# Patient Record
Sex: Male | Born: 1985 | Race: White | Hispanic: No | Marital: Single | Smoking: Never smoker
Health system: Southern US, Community
[De-identification: ages and names within clinical notes are randomized; demographics above are authoritative.]

## PROBLEM LIST (undated history)

## (undated) DIAGNOSIS — G43909 Migraine, unspecified, not intractable, without status migrainosus: Secondary | ICD-10-CM

## (undated) DIAGNOSIS — K589 Irritable bowel syndrome without diarrhea: Secondary | ICD-10-CM

## (undated) HISTORY — DX: Irritable bowel syndrome without diarrhea: K58.9

## (undated) HISTORY — DX: Migraine, unspecified, not intractable, without status migrainosus: G43.909

## (undated) HISTORY — PX: OTHER SURGICAL HISTORY: SHX169

---
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
## 2014-04-13 ENCOUNTER — Emergency Department (HOSPITAL_COMMUNITY): Payer: BC Managed Care – PPO

## 2014-04-13 ENCOUNTER — Encounter (HOSPITAL_COMMUNITY): Payer: Self-pay | Admitting: Emergency Medicine

## 2014-04-13 ENCOUNTER — Emergency Department (HOSPITAL_COMMUNITY)
Admission: EM | Admit: 2014-04-13 | Discharge: 2014-04-13 | Disposition: A | Discharge status: Home / Self Care | Payer: BC Managed Care – PPO | Attending: Emergency Medicine | Admitting: Emergency Medicine

## 2014-04-13 DIAGNOSIS — G43901 Migraine, unspecified, not intractable, with status migrainosus: Secondary | ICD-10-CM | POA: Insufficient documentation

## 2014-04-13 DIAGNOSIS — Z791 Long term (current) use of non-steroidal anti-inflammatories (NSAID): Secondary | ICD-10-CM | POA: Diagnosis not present

## 2014-04-13 DIAGNOSIS — R51 Headache: Secondary | ICD-10-CM | POA: Diagnosis present

## 2014-04-13 MED ORDER — METOCLOPRAMIDE HCL 5 MG/ML IJ SOLN
10.0000 mg | Freq: Once | INTRAMUSCULAR | Status: AC
  Administered 2014-04-13: 10 mg via INTRAVENOUS
  Filled 2014-04-13: qty 2

## 2014-04-13 MED ORDER — DIPHENHYDRAMINE HCL 50 MG/ML IJ SOLN
25.0000 mg | Freq: Once | INTRAMUSCULAR | Status: AC
  Administered 2014-04-13: 25 mg via INTRAVENOUS
  Filled 2014-04-13: qty 1

## 2014-04-13 MED ORDER — DEXAMETHASONE SODIUM PHOSPHATE 10 MG/ML IJ SOLN
10.0000 mg | Freq: Once | INTRAMUSCULAR | Status: AC
  Administered 2014-04-13: 10 mg via INTRAVENOUS
  Filled 2014-04-13: qty 1

## 2014-04-13 MED ORDER — KETOROLAC TROMETHAMINE 30 MG/ML IJ SOLN
30.0000 mg | Freq: Once | INTRAMUSCULAR | Status: AC
  Administered 2014-04-13: 30 mg via INTRAVENOUS
  Filled 2014-04-13: qty 1

## 2014-04-13 NOTE — ED Provider Notes (Signed)
Medical screening examination/treatment/procedure(s) were performed by non-physician practitioner and as supervising physician I was immediately available for consultation/collaboration.    Vida Roller, MD 04/13/14 [phone]

## 2014-04-13 NOTE — ED Notes (Addendum)
Patient c/o headache x40 days. Patient states it feels like he has been hit in the back of the head with a hammer, with a warming sensation that radiates into his neck. Patient reports lightheadedness and seeing black spots. Patient states he went to UC on Wednesday was treated for migraine, states medication slightly dulled the headache but the headache still is present. Patient states he has been taking vicodin, imitrex, voltaran, and reglan without relief. Patient c/o constant nausea, with vomiting yesterday. Denies any head trauma.

## 2014-04-13 NOTE — ED Provider Notes (Signed)
CSN: [id number]     Arrival date & time 04/13/14  0259 History   First MD Initiated Contact with Patient 04/13/14 [phone]     Chief Complaint  Patient presents with  . Migraine   HPI  History provided by the patient. Patient is a 17 male with no significant PMH presenting with complaints of persistent unimproved headache. Patient complains of a diffuse frontal and side headache that has been persistent without change for the last 40 days. He was evaluated recently at PCP office and given prescriptions for Imitrex, Reglan, Vicodin and Voltaren. Patient reports only and possibly slight relief after using Reglan but no other change in the pain. The pain is the same when he wakes up and before he goes to sleep. Yesterday he also had 3 episodes of nausea and vomiting. Pain is slightly worsened with light and noise. Denies any other or alleviating factors. No associated fever, chills or sweats. Denies any weakness or numbness in extremities. No vision change. No speech change or confusion.    History reviewed. No pertinent past medical history. History reviewed. No pertinent past surgical history. No family history on file. History  Substance Use Topics  . Smoking status: Never Smoker   . Smokeless tobacco: Not on file  . Alcohol Use: Yes     Comment: occ    Review of Systems  Constitutional: Negative for fever, chills and diaphoresis.  Eyes: Positive for photophobia.  Gastrointestinal: Positive for nausea and vomiting.  Neurological: Positive for headaches. Negative for weakness and numbness.  All other systems reviewed and are negative.     Allergies  Review of patient's allergies indicates no known allergies.  Home Medications   Prior to Admission medications   Medication Sig Start Date End Date Taking? Authorizing Provider  diclofenac (VOLTAREN) 75 MG EC tablet Take 75 mg by mouth 2 (two) times daily.   Yes Historical Provider, MD  HYDROcodone-acetaminophen (NORCO/VICODIN) 5-325  MG per tablet Take 1 tablet by mouth every 6 (six) hours as needed for moderate pain.   Yes Historical Provider, MD  metoCLOPramide (REGLAN) 10 MG tablet Take 10 mg by mouth every 6 (six) hours as needed for nausea.   Yes Historical Provider, MD  SUMAtriptan (IMITREX) 50 MG tablet Take 50 mg by mouth every 2 (two) hours as needed for migraine or headache. May repeat in 2 hours if headache persists or recurs.   Yes Historical Provider, MD   BP 133/95  Pulse 73  Temp(Src) 97.9 F (36.6 C) (Oral)  Resp 18  SpO2 100% Physical Exam  Nursing note and vitals reviewed. Constitutional: He is oriented to person, place, and time. He appears well-developed and well-nourished. No distress.  HENT:  Head: Normocephalic and atraumatic.  No sinus pressure or nasal congestion  Eyes: Conjunctivae and EOM are normal. Pupils are equal, round, and reactive to light.  Neck: Normal range of motion. Neck supple.  No meningeal sign  Cardiovascular: Normal rate and regular rhythm.   Pulmonary/Chest: Effort normal and breath sounds normal. No respiratory distress. He has no wheezes.  Abdominal: Soft. There is no tenderness.  Neurological: He is alert and oriented to person, place, and time. He has normal strength. No cranial nerve deficit or sensory deficit. Coordination and gait normal.  Skin: Skin is warm. No rash noted.  Psychiatric: He has a normal mood and affect. His behavior is normal.    ED Course  Procedures   COORDINATION OF CARE:  Nursing notes reviewed. Vital signs reviewed. Initial  pt interview and examination performed.   Filed Vitals:   04/13/14 0302  BP: 133/95  Pulse: 73  Temp: 97.9 F (36.6 C)  TempSrc: Oral  Resp: 18  SpO2: 100%    3:57 AM-patient seen and evaluated. Appears in some discomfort. Normal nonfocal neuro exam. Does report history of slightly similar headaches without this intensity or duration. Headaches typically may only lasts 72 hours at the longest period  CT  scan unremarkable. Patient now reports no headache or pain after medications. At this time he is stable for discharge home. Will provide a neurology referral. Patient agrees with plan.  Treatment plan initiated: Medications  metoCLOPramide (REGLAN) injection 10 mg (10 mg Intravenous Given 04/13/14 0413)  dexamethasone (DECADRON) injection 10 mg (10 mg Intravenous Given 04/13/14 0413)  ketorolac (TORADOL) 30 MG/ML injection 30 mg (30 mg Intravenous Given 04/13/14 0413)  diphenhydrAMINE (BENADRYL) injection 25 mg (25 mg Intravenous Given 04/13/14 0413)      Imaging Review Ct Head Wo Contrast  04/13/2014   CLINICAL DATA:  Migraines for 40 days. Lightheaded. Sees black spots.  EXAM: CT HEAD WITHOUT CONTRAST  TECHNIQUE: Contiguous axial images were obtained from the base of the skull through the vertex without intravenous contrast.  COMPARISON:  None.  FINDINGS: Ventricles and sulci appear symmetrical. No mass effect or midline shift. No abnormal extra-axial fluid collections. Gray-white matter junctions are distinct. Basal cisterns are not effaced. No evidence of acute intracranial hemorrhage. No depressed skull fractures. Visualized paranasal sinuses and mastoid air cells are not opacified.  IMPRESSION: No acute intracranial abnormalities.   Electronically Signed   By: Burman Nieves M.D.   On: 04/13/2014 04:24     MDM   Final diagnoses:  Migraine with status migrainosus, not intractable, unspecified migraine type        Angus Seller, PA-C 04/13/14 [phone]

## 2014-04-13 NOTE — ED Notes (Signed)
Patient is alert and oriented x3.  He was given DC instructions and follow up visit instructions.  Patient gave verbal understanding.  He was DC ambulatory under his own power to home.  V/S stable.  He was not showing any signs of distress on DC 

## 2014-04-13 NOTE — Discharge Instructions (Signed)
Your CT scan did not show any concerning causes for your headache and symptoms. Please follow up with your primary care provider or a neurology specialist for continued evaluation and treatment.    Migraine Headache A migraine headache is an intense, throbbing pain on one or both sides of your head. A migraine can last for 30 minutes to several hours. CAUSES  The exact cause of a migraine headache is not always known. However, a migraine may be caused when nerves in the brain become irritated and release chemicals that cause inflammation. This causes pain. Certain things may also trigger migraines, such as:  Alcohol.  Smoking.  Stress.  Menstruation.  Aged cheeses.  Foods or drinks that contain nitrates, glutamate, aspartame, or tyramine.  Lack of sleep.  Chocolate.  Caffeine.  Hunger.  Physical exertion.  Fatigue.  Medicines used to treat chest pain (nitroglycerine), birth control pills, estrogen, and some blood pressure medicines. SIGNS AND SYMPTOMS  Pain on one or both sides of your head.  Pulsating or throbbing pain.  Severe pain that prevents daily activities.  Pain that is aggravated by any physical activity.  Nausea, vomiting, or both.  Dizziness.  Pain with exposure to bright lights, loud noises, or activity.  General sensitivity to bright lights, loud noises, or smells. Before you get a migraine, you may get warning signs that a migraine is coming (aura). An aura may include:  Seeing flashing lights.  Seeing bright spots, halos, or zigzag lines.  Having tunnel vision or blurred vision.  Having feelings of numbness or tingling.  Having trouble talking.  Having muscle weakness. DIAGNOSIS  A migraine headache is often diagnosed based on:  Symptoms.  Physical exam.  A CT scan or MRI of your head. These imaging tests cannot diagnose migraines, but they can help rule out other causes of headaches. TREATMENT Medicines may be given for pain  and nausea. Medicines can also be given to help prevent recurrent migraines.  HOME CARE INSTRUCTIONS  Only take over-the-counter or prescription medicines for pain or discomfort as directed by your health care provider. The use of long-term narcotics is not recommended.  Lie down in a dark, quiet room when you have a migraine.  Keep a journal to find out what may trigger your migraine headaches. For example, write down:  What you eat and drink.  How much sleep you get.  Any change to your diet or medicines.  Limit alcohol consumption.  Quit smoking if you smoke.  Get 7-9 hours of sleep, or as recommended by your health care provider.  Limit stress.  Keep lights dim if bright lights bother you and make your migraines worse. SEEK IMMEDIATE MEDICAL CARE IF:   Your migraine becomes severe.  You have a fever.  You have a stiff neck.  You have vision loss.  You have muscular weakness or loss of muscle control.  You start losing your balance or have trouble walking.  You feel faint or pass out.  You have severe symptoms that are different from your first symptoms. MAKE SURE YOU:   Understand these instructions.  Will watch your condition.  Will get help right away if you are not doing well or get worse. Document Released: 08/03/2005 Document Revised: 12/18/2013 Document Reviewed: 04/10/2013 [facility] Patient Information 2015 [place], [place]. This information is not intended to replace advice given to you by your health care provider. Make sure you discuss any questions you have with your health care provider.

## 2014-04-16 ENCOUNTER — Ambulatory Visit (INDEPENDENT_AMBULATORY_CARE_PROVIDER_SITE_OTHER): Payer: BC Managed Care – PPO | Admitting: Neurology

## 2014-04-16 ENCOUNTER — Encounter: Payer: Self-pay | Admitting: Neurology

## 2014-04-16 ENCOUNTER — Telehealth: Payer: Self-pay | Admitting: Neurology

## 2014-04-16 VITALS — BP 114/72 | HR 68 | Ht 64.0 in | Wt 119.0 lb

## 2014-04-16 DIAGNOSIS — K589 Irritable bowel syndrome without diarrhea: Secondary | ICD-10-CM | POA: Insufficient documentation

## 2014-04-16 DIAGNOSIS — G43909 Migraine, unspecified, not intractable, without status migrainosus: Secondary | ICD-10-CM | POA: Insufficient documentation

## 2014-04-16 DIAGNOSIS — G43111 Migraine with aura, intractable, with status migrainosus: Secondary | ICD-10-CM

## 2014-04-16 MED ORDER — RIZATRIPTAN BENZOATE 10 MG PO TBDP: 10.0000 mg | ORAL_TABLET | ORAL | Status: DC | PRN

## 2014-04-16 MED ORDER — TOPIRAMATE 100 MG PO TABS: ORAL_TABLET | ORAL | Status: AC

## 2014-04-16 MED ORDER — TOPIRAMATE 100 MG PO TABS: ORAL_TABLET | ORAL | Status: DC

## 2014-04-16 MED ORDER — RIZATRIPTAN BENZOATE 10 MG PO TBDP: 10.0000 mg | ORAL_TABLET | ORAL | Status: AC | PRN

## 2014-04-16 MED ORDER — INDOMETHACIN 50 MG PO CAPS: 50.0000 mg | ORAL_CAPSULE | Freq: Three times a day (TID) | ORAL | Status: AC

## 2014-04-16 MED ORDER — INDOMETHACIN 50 MG PO CAPS: 50.0000 mg | ORAL_CAPSULE | Freq: Three times a day (TID) | ORAL | Status: DC

## 2014-04-16 NOTE — Progress Notes (Signed)
PATIENT: Jamie Banks DOB: [birth year]  HISTORICAL  Jamie Banks is a 28 years old right-handed Caucasian male, referred by [facility] emergency room, and his primary care physician for evaluation of new-onset headaches.  He was previously healthy, deny previous history of headaches, at the end of June 2015, he began to notice right occipital region pain, also subjective numbness around the area, the pain was moderate to severe, 7 out of 10, couple hours of the day, date wood exacerbated to a much severe pain, mild of pain, with associated dizziness, nausea, vomiting sensation  He been having pain constant daily basis over the past 40 days, he denies significant visual change, no lateralized motor or sensory deficit  He presented to the emergency room April 13 2014, Is normal he was getting IV migraine cocktail, without helping his headache much  He has been taking daily Imitrex tablet, Raglan as needed, diclofenac, without helping his symptoms, he has no autonomic symptoms.  He was able to work daily, at the second shift at a Musician job.  REVIEW OF SYSTEMS: Full 14 system review of systems performed and notable only for headaches, numbness, fever, chills, fatigue.  ALLERGIES: No Known Allergies  HOME MEDICATIONS: Current Outpatient Prescriptions on File Prior to Visit  Medication Sig Dispense Refill  . diclofenac (VOLTAREN) 75 MG EC tablet Take 75 mg by mouth 2 (two) times daily.      Marland Kitchen HYDROcodone-acetaminophen (NORCO/VICODIN) 5-325 MG per tablet Take 1 tablet by mouth every 6 (six) hours as needed for moderate pain.      Marland Kitchen metoCLOPramide (REGLAN) 10 MG tablet Take 10 mg by mouth every 6 (six) hours as needed for nausea.      . SUMAtriptan (IMITREX) 50 MG tablet Take 50 mg by mouth every 2 (two) hours as needed for migraine or headache. May repeat in 2 hours if headache persists or recurs.         PAST MEDICAL HISTORY: Past Medical History  Diagnosis Date  . Migraine     . IBS (irritable bowel syndrome)     PAST SURGICAL HISTORY: Past Surgical History  Procedure Laterality Date  . None      FAMILY HISTORY: No family history on file.  SOCIAL HISTORY:  History   Social History  . Marital Status: Single    Spouse Name: N/A    Number of Children: 0  . Years of Education: 12th   Occupational History    [employer]   Social History Main Topics  . Smoking status: Never Smoker   . Smokeless tobacco: Never Used  . Alcohol Use: 0.5 oz/week    1 drink(s) per week     Comment: occ with dinner  . Drug Use: No  . Sexual Activity: Not on file   Other Topics Concern  . Not on file   Social History Narrative   Patient lives at home with his girlfriend. Patient works full time [employer].    Education high school.   Caffeine three sodas daily.   Right handed.     PHYSICAL EXAM   Filed Vitals:   04/16/14 1018  BP: 114/72  Pulse: 68  Height:  (1.626 m)  Weight: 119 lb (53.978 kg)    Not recorded    Body mass index is 20.42 kg/(m^2).   Generalized: In no acute distress  Neck: Supple, no carotid bruits   Cardiac: Regular rate rhythm  Pulmonary: Clear to auscultation bilaterally  Musculoskeletal: No deformity  Neurological  examination  Mentation: Alert oriented to time, place, history taking, and causual conversation  Cranial nerve II-XII: Pupils were equal round reactive to light. Extraocular movements were full.  Visual field were full on confrontational test. Bilateral fundi were sharp.  Facial sensation and strength were normal. Hearing was intact to finger rubbing bilaterally. Uvula tongue midline.  Head turning and shoulder shrug and were normal and symmetric.Tongue protrusion into cheek strength was normal.  Motor: Normal tone, bulk and strength.  Sensory: Intact to fine touch, pinprick, preserved vibratory sensation, and proprioception at toes.  Coordination: Normal finger to nose, heel-to-shin bilaterally there was no truncal  ataxia  Gait: Rising up from seated position without assistance, normal stance, without trunk ataxia, moderate stride, good arm swing, smooth turning, able to perform tiptoe, and heel walking without difficulty.   Romberg signs: Negative  Deep tendon reflexes: Brachioradialis 2/2, biceps 2/2, triceps 2/2, patellar 2/2, Achilles 2/2, plantar responses were flexor bilaterally.   DIAGNOSTIC DATA (LABS, IMAGING, TESTING) - I reviewed patient records, labs, notes, testing and imaging myself where available.  ASSESSMENT AND PLAN  Jamie Banks is a 28 y.o. male presenting with constant right-sided headaches, normal neurological examination, CAT scan of the brain,  1. 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 2. Try preventive medications Topamax 100 mg twice a day, indomethacin 50 mg 3 times a day, Maxalt as needed 3. Return to clinic in 2 weeks    Levert Feinstein, M.D. Ph.D.  [facility] [address], [address] [place], [place] [address] [phone]

## 2014-04-16 NOTE — Telephone Encounter (Signed)
Pharmacy updated in chart.  Rx's resent.

## 2014-04-16 NOTE — Telephone Encounter (Signed)
Patient just saw Dr. Arrie Aran sent to wrong pharmacy--patient wants Rx's sent to [facility] in [facility] and please change in chart to that pharmacy--thank you.

## 2014-04-17 ENCOUNTER — Telehealth: Payer: Self-pay | Admitting: Neurology

## 2014-04-17 ENCOUNTER — Telehealth: Payer: Self-pay | Admitting: *Deleted

## 2014-04-17 DIAGNOSIS — G43001 Migraine without aura, not intractable, with status migrainosus: Secondary | ICD-10-CM

## 2014-04-17 MED ORDER — METHYLPREDNISOLONE (PAK) 4 MG PO TABS: ORAL_TABLET | ORAL | Status: AC

## 2014-04-17 NOTE — Telephone Encounter (Signed)
Pt called and had reaction to meds that he took this am.   Topamax 1/2 tab and then indocin 50 mg capsule around 1000 this am.  He stated that he feels like in a brain fog, jellyfish, brain not working. Took maxalt last pm x 2 tabs and headache did not change.  He continues with headache.  Had to mis work.  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 I told him  not take anymore until hears back from [place].

## 2014-04-17 NOTE — Telephone Encounter (Signed)
Patient calling to state that his head is causing him "excruciating pain" and he would like some sort of pain medication to be called in for relief, please return call and advise.

## 2014-04-17 NOTE — Telephone Encounter (Signed)
I have called Jamie Banks, he continued to complain severe headaches, has been taking indomethacin 3 times a day, tried Maxalt without helping him much, he complained generalized weakness, feeling goggy  1. I have suggested him stop taking Topamax,  2, will check MRI of the brain with out contrast  3, Medrol Pack.  4, return to clinic in September 14

## 2014-04-17 NOTE — Telephone Encounter (Signed)
Please see previous note.  Dr Terrace Arabia just spoke with the patient.

## 2014-04-19 ENCOUNTER — Telehealth: Payer: Self-pay | Admitting: Neurology

## 2014-04-19 NOTE — Telephone Encounter (Signed)
Reviewed

## 2014-04-19 NOTE — Telephone Encounter (Signed)
FYI: PATIENT WAS CALLED TO SCHEDULED APPT HE NO LONGER WANTS TREATMENT.

## 2014-04-30 ENCOUNTER — Ambulatory Visit: Payer: BC Managed Care – PPO | Admitting: Neurology

## 2015-01-28 IMAGING — CT CT HEAD W/O CM
2 series · 16 of 30 positions shown, 20 images · non-contrast
Comparison: None.

CLINICAL DATA: Migraines for 40 days. Lightheaded. Sees black
spots.

EXAM:
CT HEAD WITHOUT CONTRAST
TECHNIQUE: Contiguous axial images were obtained from the base of the skull
through the vertex without intravenous contrast.

[Series 2: head w/o · axial · non-contrast · 0.45mm/px · z∈[-81,+44]mm · 13 of 31 slices shown, 17 images]
[im 3/31  brain]
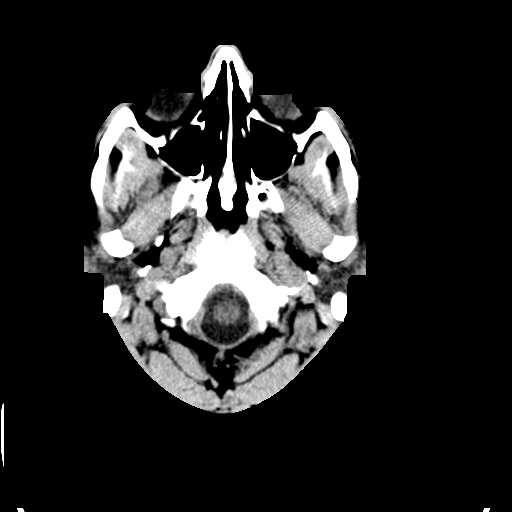
[im 3/31  bone]
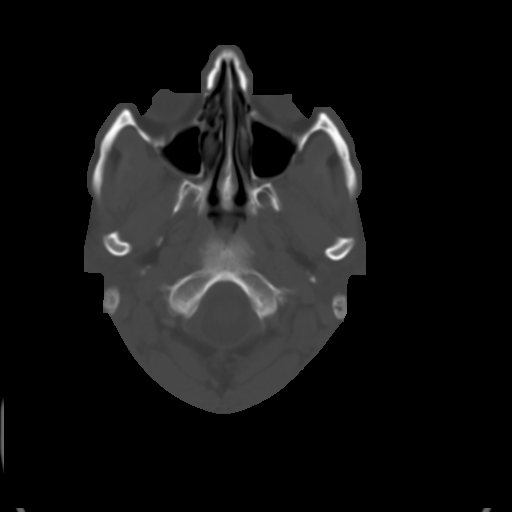
[im 5/31  brain]
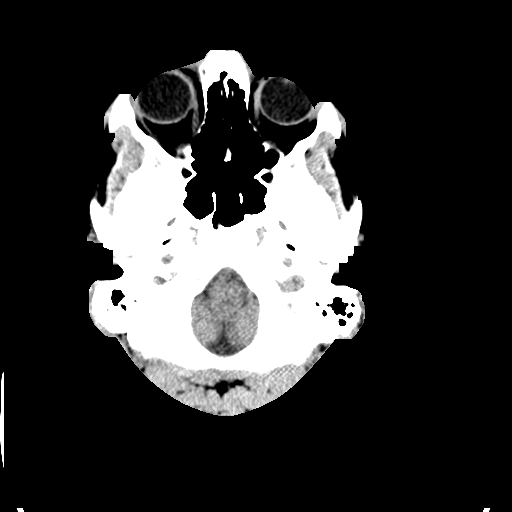
[im 7/31  brain]
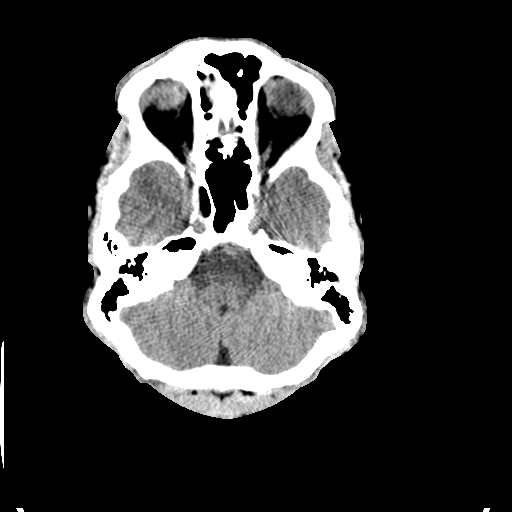
[im 9/31  brain]
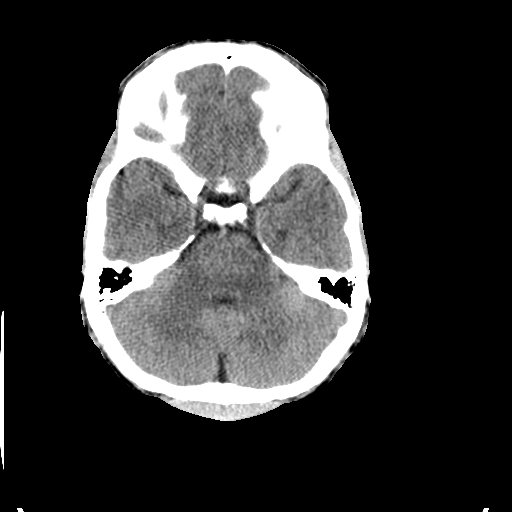
[im 11/31  brain]
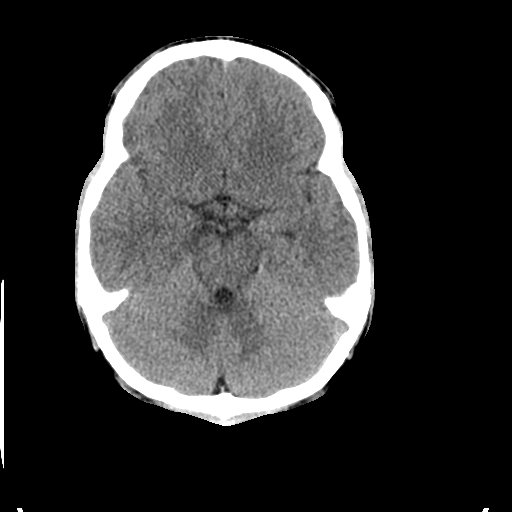
[im 11/31  bone]
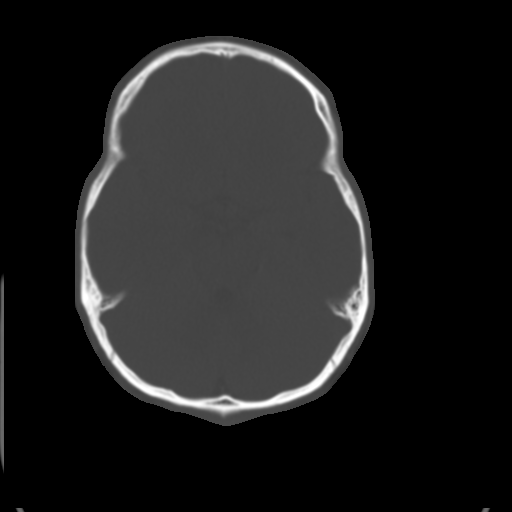
[im 13/31  brain]
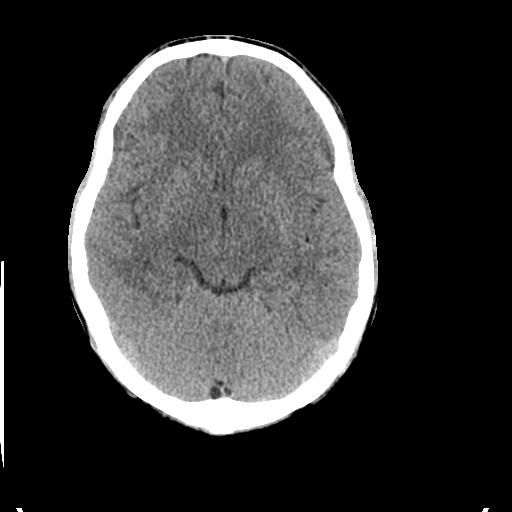
[im 16/31  brain]
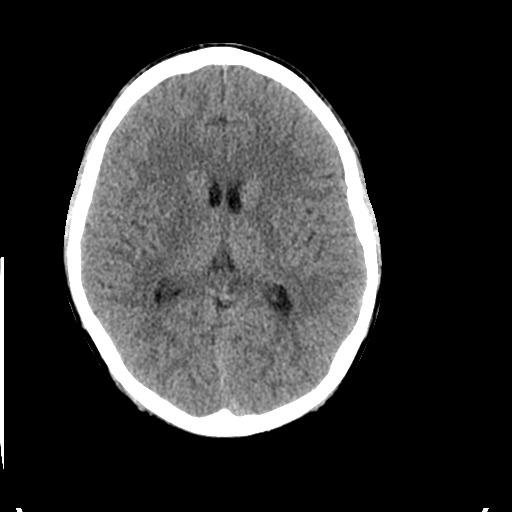
[im 18/31  brain]
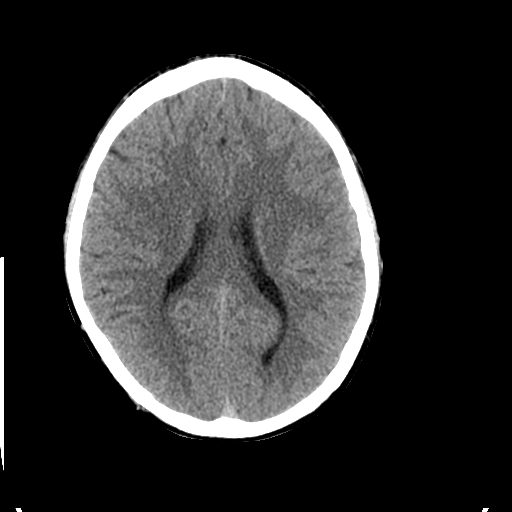
[im 20/31  brain]
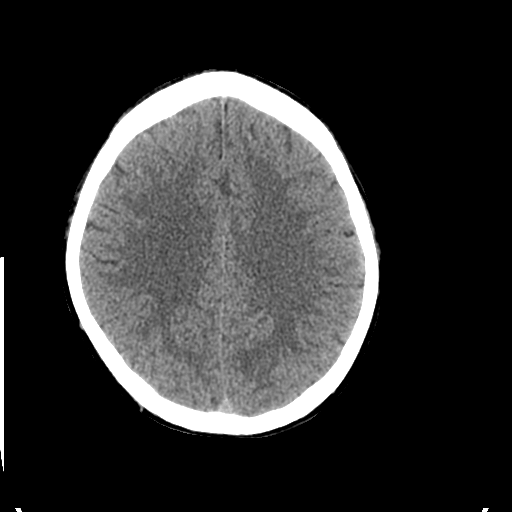
[im 20/31  bone]
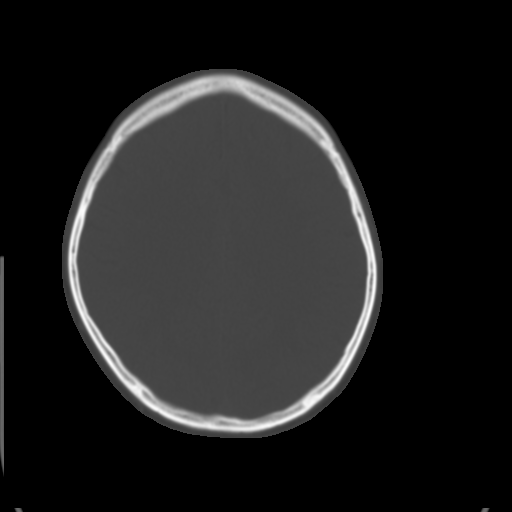
[im 22/31  brain]
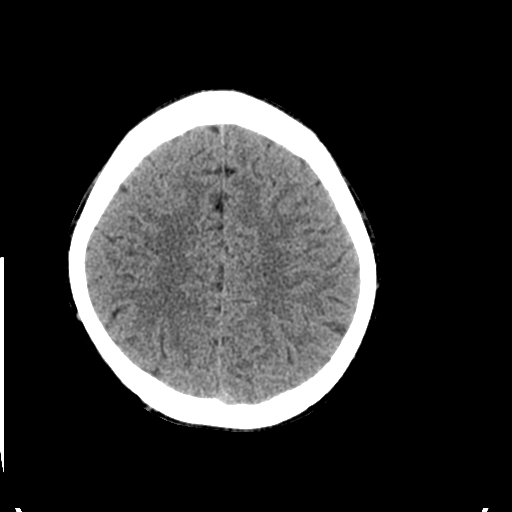
[im 24/31  brain]
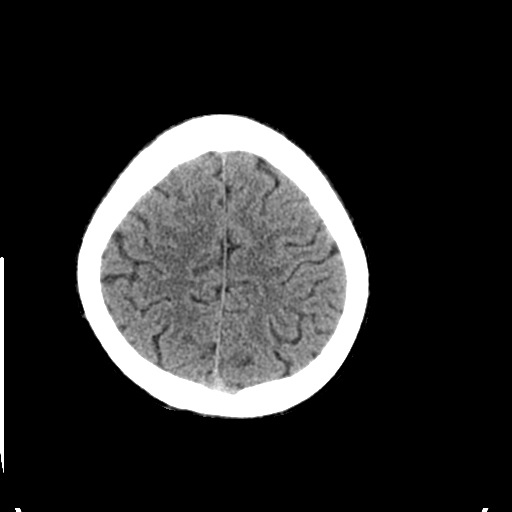
[im 26/31  brain]
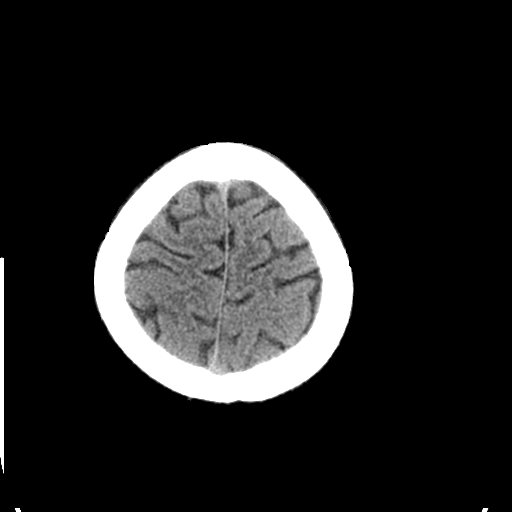
[im 28/31  brain]
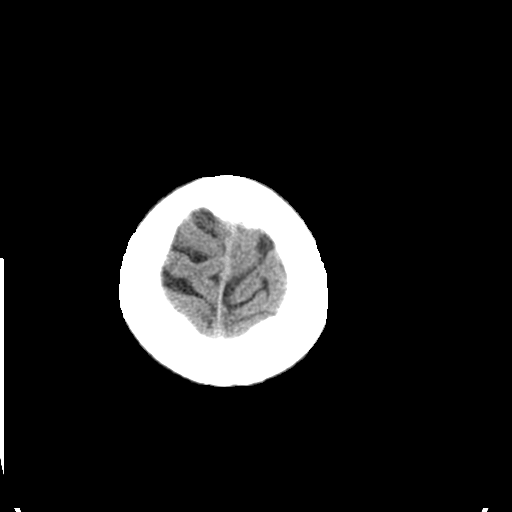
[im 28/31  bone]
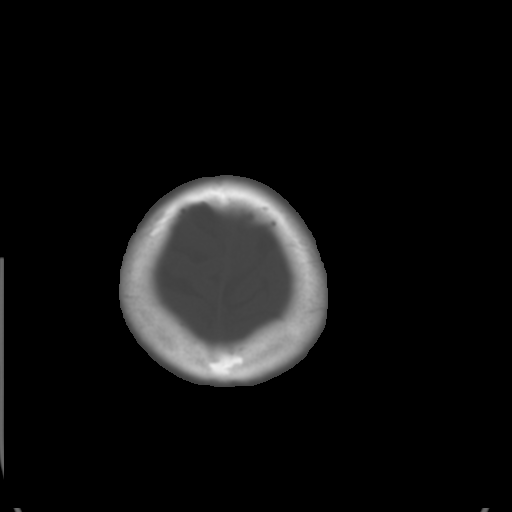

[Series 3: bone windows · axial · 0.45mm/px · z∈[-81,-41]mm · 3 of 31 slices shown]
[im 3/31  bone]
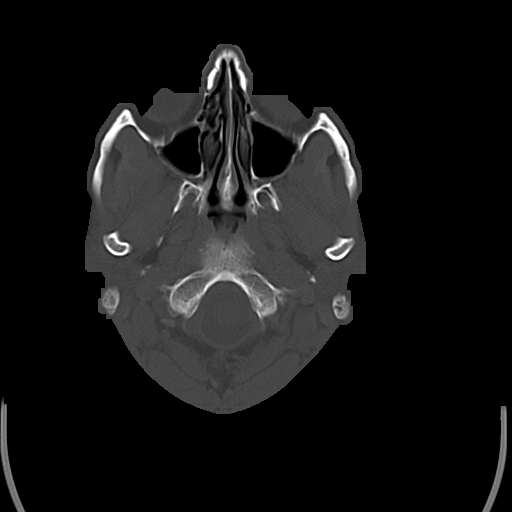
[im 7/31  bone]
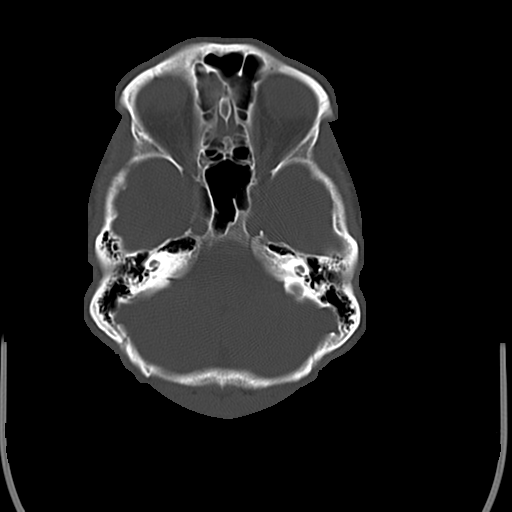
[im 11/31  bone]
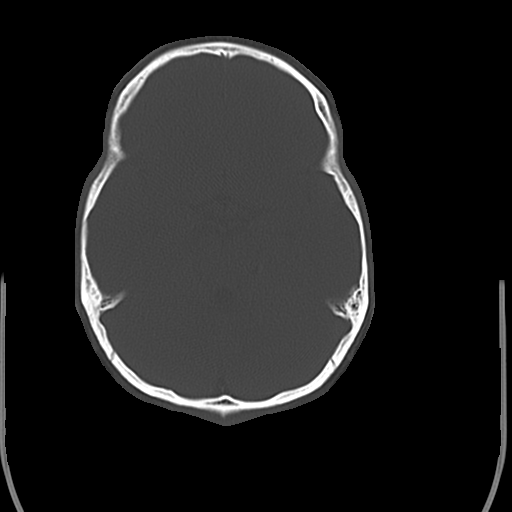

[16 of 30 positions shown; findings below may reference images not displayed]

FINDINGS: Ventricles and sulci appear symmetrical. No mass effect or midline
shift. No abnormal extra-axial fluid collections. Gray-white matter
junctions are distinct. Basal cisterns are not effaced. No evidence
of acute intracranial hemorrhage. No depressed skull fractures.
Visualized paranasal sinuses and mastoid air cells are not
opacified.
IMPRESSION: No acute intracranial abnormalities.
